# Patient Record
Sex: Male | Born: 1990 | Race: White | Hispanic: No | Marital: Single | Smoking: Current every day smoker
Health system: Southern US, Community
[De-identification: ages and names within clinical notes are randomized; demographics above are authoritative.]

---

## 2014-02-16 ENCOUNTER — Emergency Department (HOSPITAL_COMMUNITY): Payer: No Typology Code available for payment source

## 2014-02-16 ENCOUNTER — Encounter (HOSPITAL_COMMUNITY): Payer: Self-pay | Admitting: Emergency Medicine

## 2014-02-16 ENCOUNTER — Emergency Department (HOSPITAL_COMMUNITY)
Admission: EM | Admit: 2014-02-16 | Discharge: 2014-02-16 | Disposition: A | Discharge status: Home / Self Care | Payer: No Typology Code available for payment source | Attending: Emergency Medicine | Admitting: Emergency Medicine

## 2014-02-16 DIAGNOSIS — Y92410 Unspecified street and highway as the place of occurrence of the external cause: Secondary | ICD-10-CM | POA: Insufficient documentation

## 2014-02-16 DIAGNOSIS — S0990XA Unspecified injury of head, initial encounter: Secondary | ICD-10-CM | POA: Insufficient documentation

## 2014-02-16 DIAGNOSIS — Y9389 Activity, other specified: Secondary | ICD-10-CM | POA: Insufficient documentation

## 2014-02-16 DIAGNOSIS — Z23 Encounter for immunization: Secondary | ICD-10-CM | POA: Insufficient documentation

## 2014-02-16 DIAGNOSIS — IMO0002 Reserved for concepts with insufficient information to code with codable children: Secondary | ICD-10-CM | POA: Insufficient documentation

## 2014-02-16 DIAGNOSIS — R404 Transient alteration of awareness: Secondary | ICD-10-CM | POA: Insufficient documentation

## 2014-02-16 MED ORDER — TETANUS-DIPHTH-ACELL PERTUSSIS 5-2.5-18.5 LF-MCG/0.5 IM SUSP
0.5000 mL | Freq: Once | INTRAMUSCULAR | Status: AC
  Administered 2014-02-16: 0.5 mL via INTRAMUSCULAR

## 2014-02-16 MED ORDER — TETANUS-DIPHTHERIA TOXOIDS TD 5-2 LFU IM INJ
0.5000 mL | INJECTION | Freq: Once | INTRAMUSCULAR | Status: DC
  Filled 2014-02-16: qty 0.5

## 2014-02-16 MED ORDER — ACETAMINOPHEN 325 MG PO TABS
650.0000 mg | ORAL_TABLET | Freq: Once | ORAL | Status: AC
  Administered 2014-02-16: 650 mg via ORAL
  Filled 2014-02-16: qty 2

## 2014-02-16 NOTE — ED Notes (Signed)
Pt discharged home with all belongings, pt alert, oriented, and ambulatory upon discharge, no new rx prescribed, pt verbalizes understanding of discharge instructions, pt driven home by family

## 2014-02-16 NOTE — ED Notes (Signed)
MD at bedside to suture laceration, EDP aware of pain
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
## 2014-02-16 NOTE — ED Notes (Signed)
Patient transported to CT 
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
## 2014-02-16 NOTE — ED Notes (Signed)
Family at bedside. 
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
